# Patient Record
Sex: Female | Born: 1965 | Race: Black or African American | Hispanic: No | Marital: Married | Smoking: Never smoker
Health system: Southern US, Community
[De-identification: ages and names within clinical notes are randomized; demographics above are authoritative.]

## PROBLEM LIST (undated history)

## (undated) DIAGNOSIS — E119 Type 2 diabetes mellitus without complications: Secondary | ICD-10-CM

---

## 2008-07-14 ENCOUNTER — Inpatient Hospital Stay (HOSPITAL_COMMUNITY): Admission: EM | Admit: 2008-07-14 | Discharge: 2008-07-24 | Payer: Self-pay | Admitting: Emergency Medicine

## 2010-06-01 LAB — GLUCOSE, CAPILLARY
Glucose-Capillary: 130 mg/dL — ABNORMAL HIGH (ref 70–99)
Glucose-Capillary: 138 mg/dL — ABNORMAL HIGH (ref 70–99)
Glucose-Capillary: 179 mg/dL — ABNORMAL HIGH (ref 70–99)

## 2010-06-02 LAB — BASIC METABOLIC PANEL
BUN: 1 mg/dL — ABNORMAL LOW (ref 6–23)
CO2: 27 mEq/L (ref 19–32)
CO2: 29 mEq/L (ref 19–32)
CO2: 29 mEq/L (ref 19–32)
Calcium: 8.1 mg/dL — ABNORMAL LOW (ref 8.4–10.5)
Calcium: 8.4 mg/dL (ref 8.4–10.5)
Calcium: 9.2 mg/dL (ref 8.4–10.5)
Chloride: 100 mEq/L (ref 96–112)
Chloride: 107 mEq/L (ref 96–112)
Chloride: 97 mEq/L (ref 96–112)
Creatinine, Ser: 0.56 mg/dL (ref 0.4–1.2)
GFR calc Af Amer: >60 mL/min (ref >60)
GFR calc Af Amer: >60 mL/min (ref >60)
GFR calc Af Amer: >60 mL/min (ref >60)
GFR calc non Af Amer: >60 mL/min (ref >60)
GFR calc non Af Amer: >60 mL/min (ref >60)
GFR calc non Af Amer: >60 mL/min (ref >60)
Glucose, Bld: 131 mg/dL — ABNORMAL HIGH (ref 70–99)
Glucose, Bld: 181 mg/dL — ABNORMAL HIGH (ref 70–99)
Potassium: 3.4 mEq/L — ABNORMAL LOW (ref 3.5–5.1)
Potassium: 3.7 mEq/L (ref 3.5–5.1)
Sodium: 135 mEq/L (ref 135–145)
Sodium: 135 mEq/L (ref 135–145)
Sodium: 141 mEq/L (ref 135–145)

## 2010-06-02 LAB — URINE CULTURE: Culture: NO GROWTH

## 2010-06-02 LAB — BODY FLUID CULTURE

## 2010-06-02 LAB — CBC
HCT: 40.1 % (ref 36.0–46.0)
HCT: 43.2 % (ref 36.0–46.0)
Hemoglobin: 12.7 g/dL (ref 12.0–15.0)
Hemoglobin: 13.8 g/dL (ref 12.0–15.0)
Hemoglobin: 9.8 g/dL — ABNORMAL LOW (ref 12.0–15.0)
MCHC: 32 g/dL (ref 30.0–36.0)
MCHC: 32.7 g/dL (ref 30.0–36.0)
MCV: 88.5 fL (ref 78.0–100.0)
MCV: 88.8 fL (ref 78.0–100.0)
Platelets: 358 10*3/uL (ref 150–400)
RBC: 3.39 MIL/uL — ABNORMAL LOW (ref 3.87–5.11)
RBC: 3.89 MIL/uL (ref 3.87–5.11)
RBC: 4.51 MIL/uL (ref 3.87–5.11)
RDW: 13.4 % (ref 11.5–15.5)
RDW: 13.4 % (ref 11.5–15.5)
WBC: 9 10*3/uL (ref 4.0–10.5)
WBC: 9.4 10*3/uL (ref 4.0–10.5)
WBC: 9.8 10*3/uL (ref 4.0–10.5)

## 2010-06-02 LAB — GLUCOSE, CAPILLARY
Glucose-Capillary: 109 mg/dL — ABNORMAL HIGH (ref 70–99)
Glucose-Capillary: 121 mg/dL — ABNORMAL HIGH (ref 70–99)
Glucose-Capillary: 136 mg/dL — ABNORMAL HIGH (ref 70–99)
Glucose-Capillary: 136 mg/dL — ABNORMAL HIGH (ref 70–99)
Glucose-Capillary: 139 mg/dL — ABNORMAL HIGH (ref 70–99)
Glucose-Capillary: 140 mg/dL — ABNORMAL HIGH (ref 70–99)
Glucose-Capillary: 144 mg/dL — ABNORMAL HIGH (ref 70–99)
Glucose-Capillary: 146 mg/dL — ABNORMAL HIGH (ref 70–99)
Glucose-Capillary: 153 mg/dL — ABNORMAL HIGH (ref 70–99)
Glucose-Capillary: 154 mg/dL — ABNORMAL HIGH (ref 70–99)
Glucose-Capillary: 156 mg/dL — ABNORMAL HIGH (ref 70–99)
Glucose-Capillary: 156 mg/dL — ABNORMAL HIGH (ref 70–99)
Glucose-Capillary: 157 mg/dL — ABNORMAL HIGH (ref 70–99)
Glucose-Capillary: 175 mg/dL — ABNORMAL HIGH (ref 70–99)
Glucose-Capillary: 175 mg/dL — ABNORMAL HIGH (ref 70–99)
Glucose-Capillary: 178 mg/dL — ABNORMAL HIGH (ref 70–99)
Glucose-Capillary: 189 mg/dL — ABNORMAL HIGH (ref 70–99)
Glucose-Capillary: 198 mg/dL — ABNORMAL HIGH (ref 70–99)

## 2010-06-02 LAB — URINALYSIS, ROUTINE W REFLEX MICROSCOPIC
Glucose, UA: NEGATIVE mg/dL
Hgb urine dipstick: NEGATIVE
Nitrite: NEGATIVE
Protein, ur: NEGATIVE mg/dL
Specific Gravity, Urine: 1.018 (ref 1.005–1.030)
Urobilinogen, UA: 1 mg/dL (ref 0.0–1.0)
Urobilinogen, UA: 1 mg/dL (ref 0.0–1.0)

## 2010-06-02 LAB — LIPASE, BLOOD: Lipase: 22 U/L (ref 11–59)

## 2010-06-02 LAB — COMPREHENSIVE METABOLIC PANEL
BUN: 5 mg/dL — ABNORMAL LOW (ref 6–23)
Calcium: 10.1 mg/dL (ref 8.4–10.5)
Creatinine, Ser: 0.6 mg/dL (ref 0.4–1.2)
GFR calc non Af Amer: >60 mL/min (ref >60)
Glucose, Bld: 156 mg/dL — ABNORMAL HIGH (ref 70–99)
Total Protein: 7.9 g/dL (ref 6.0–8.3)

## 2010-06-02 LAB — DIFFERENTIAL
Lymphs Abs: 1.7 10*3/uL (ref 0.7–4.0)
Monocytes Relative: 6 % (ref 3–12)
Neutro Abs: 8.9 10*3/uL — ABNORMAL HIGH (ref 1.7–7.7)
Neutrophils Relative %: 78 % — ABNORMAL HIGH (ref 43–77)

## 2010-06-02 LAB — ANAEROBIC CULTURE

## 2010-06-17 IMAGING — CT CT ABDOMEN W/ CM
2 of 5 series · 17 of 46 positions shown, 19 images · IV contrast (APPLIED)
Comparison: None

CT ABDOMEN

CLINICAL DATA: Abdominal pain, vomiting.

CT ABDOMEN AND PELVIS WITH CONTRAST
TECHNIQUE: Multidetector CT imaging of the abdomen and pelvis was
performed using the standard protocol following bolus
administration of intravenous contrast.
Contrast: 125 ml Kmnipaque-ULL

[Series 2: abd_pel 5.0 b40f st · axial · 0.91mm/px · z∈[-14,+376]mm · 14 of 88 slices shown, 16 images]
[im 5/88  soft-tissue]
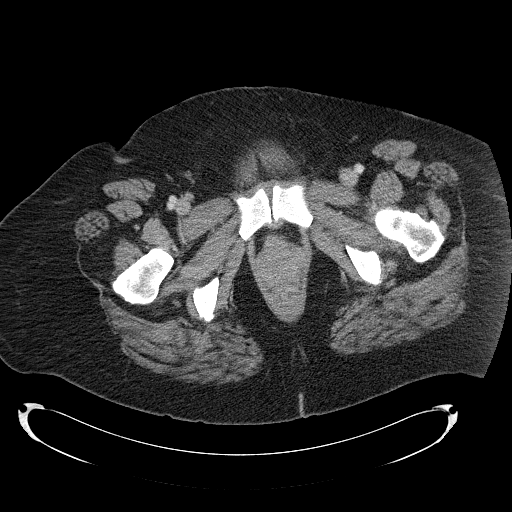
[im 5/88  bone]
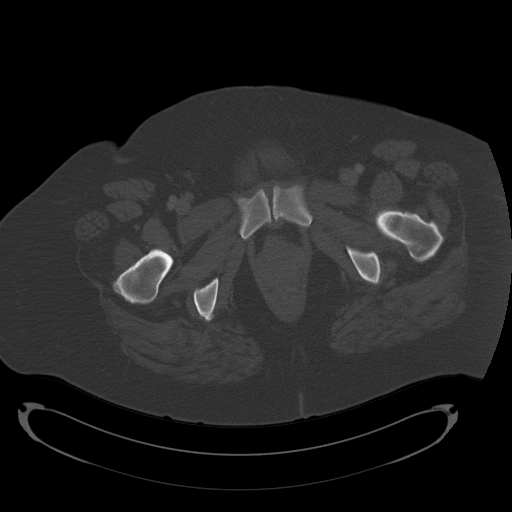
[im 10/88  soft-tissue]
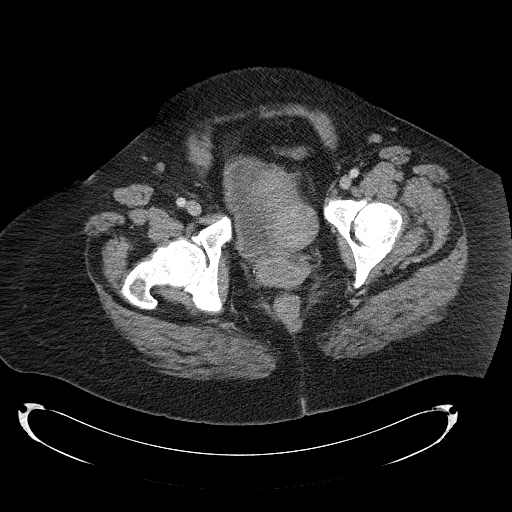
[im 19/88  soft-tissue]
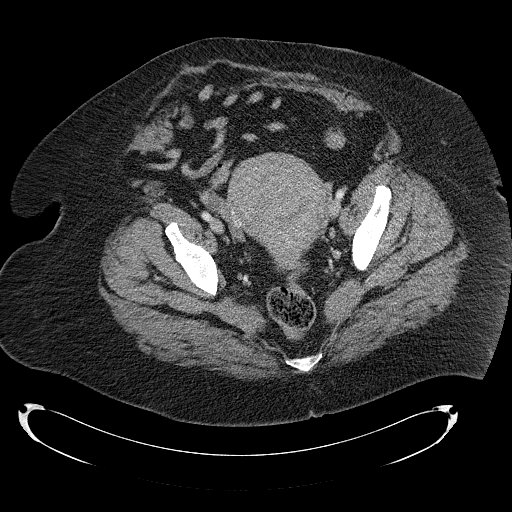
[im 23/88  soft-tissue]
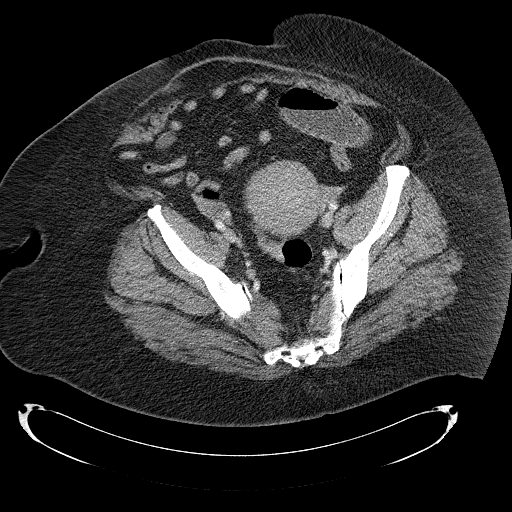
[im 28/88  soft-tissue]
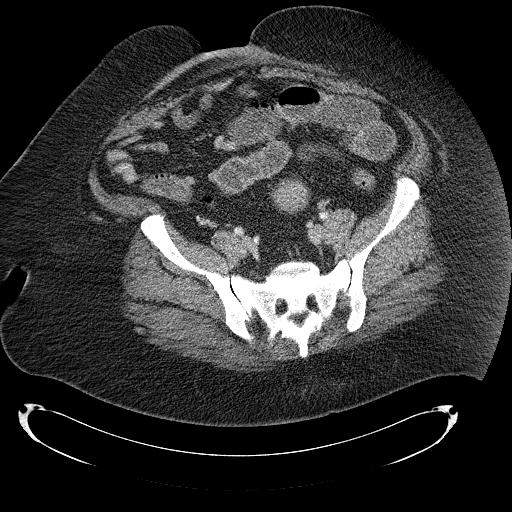
[im 37/88  soft-tissue]
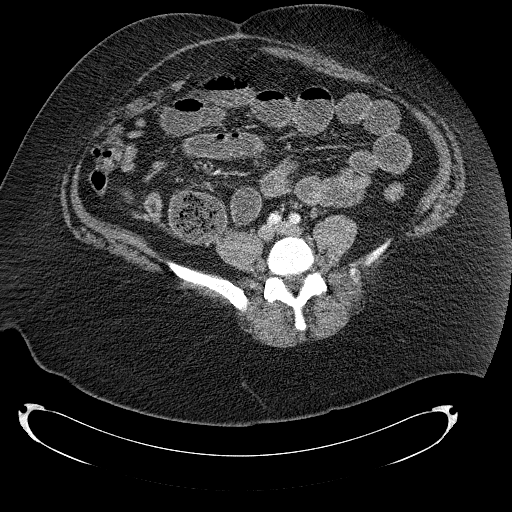
[im 42/88  soft-tissue]
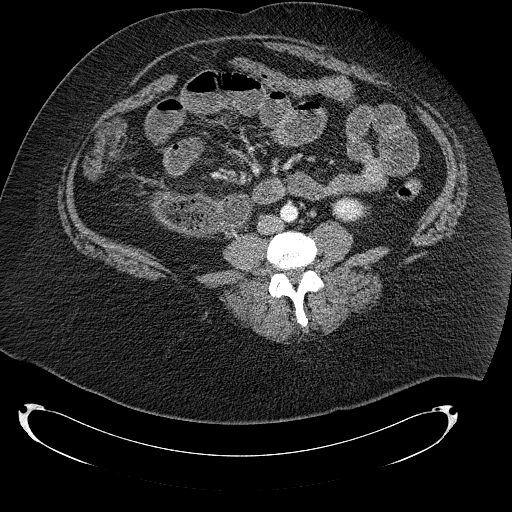
[im 46/88  soft-tissue]
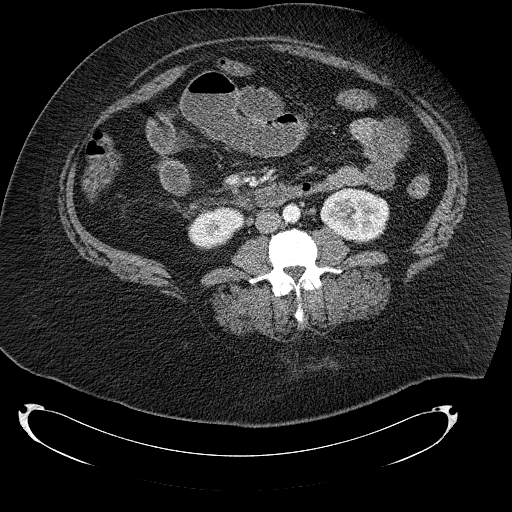
[im 51/88  soft-tissue]
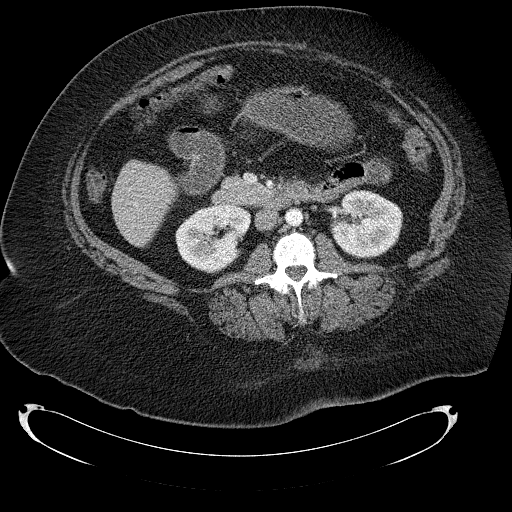
[im 51/88  bone]
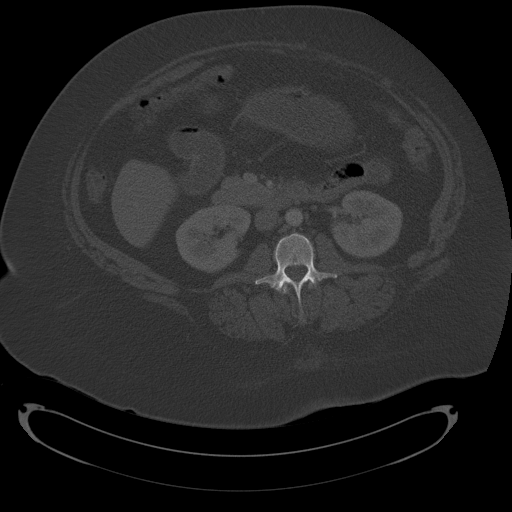
[im 60/88  soft-tissue]
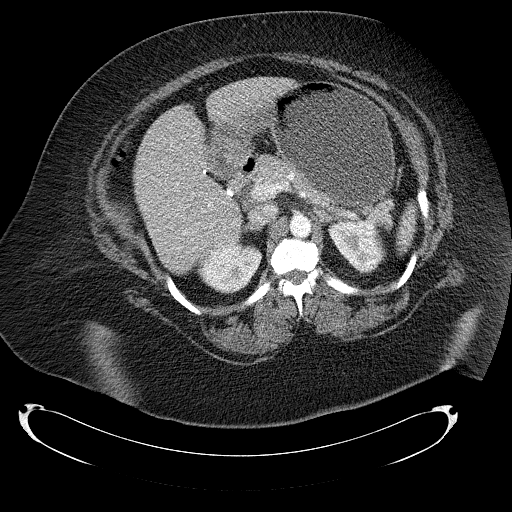
[im 65/88  soft-tissue]
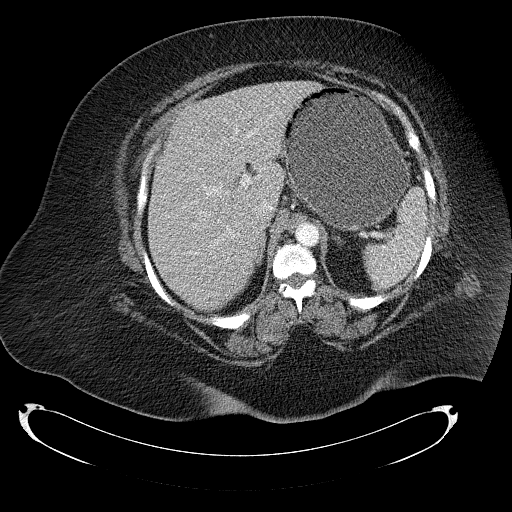
[im 69/88  soft-tissue]
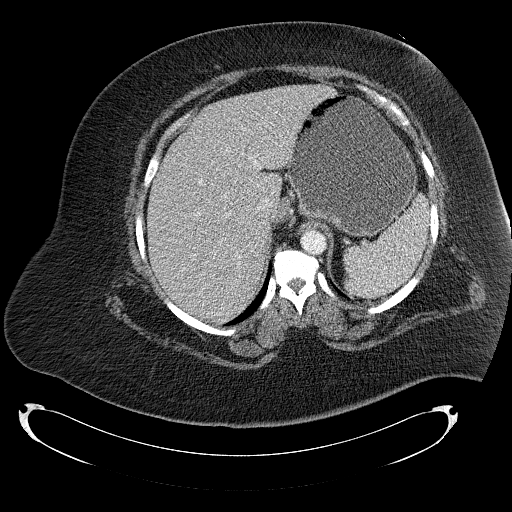
[im 78/88  soft-tissue]
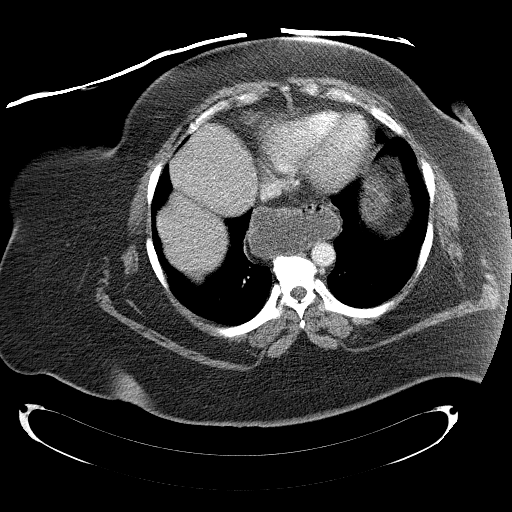
[im 83/88  soft-tissue]
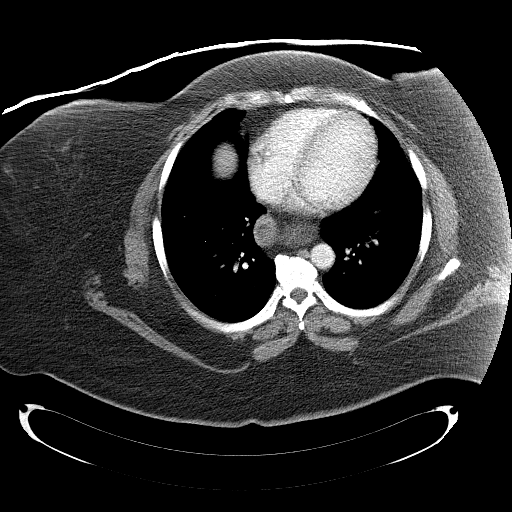

[Series 602: coronal · coronal · 0.91mm/px · 3 of 104 slices shown]
[im 35/104  soft-tissue]
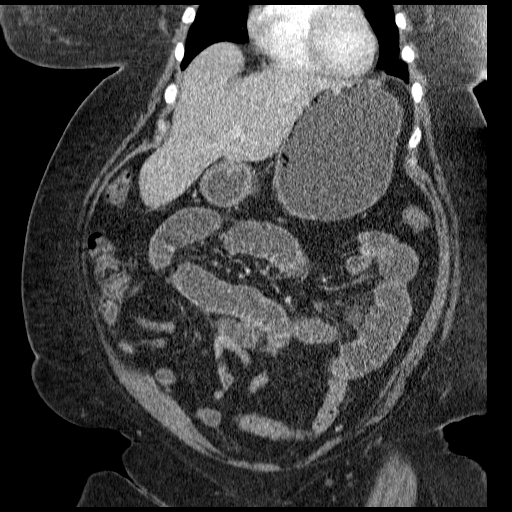
[im 46/104  soft-tissue]
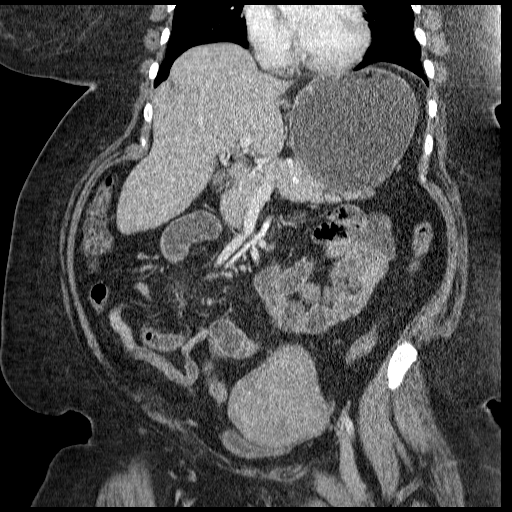
[im 58/104  soft-tissue]
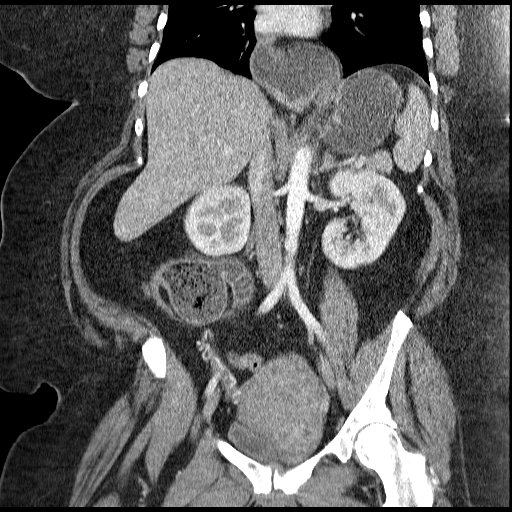

[17 of 46 positions shown; findings below may reference images not displayed]

FINDINGS: There is a large hiatal hernia present.  Distal
esophagus is fluid-filled, likely related to reflux.  Stomach
mildly dilated fluid-filled.  Dilated proximal small bowel loops
are present with air-fluid levels.  Distal small bowel in the right
lower quadrant is decompressed.  There is a transition to normal
caliber small bowel in the right lower abdomen and upper pelvis.
Findings compatible with partial small bowel obstruction.

Liver, spleen, pancreas, adrenals, kidneys unremarkable. No
hydronephrosis. No biliary ductal dilatation. The patient is status
post cholecystectomy.  Aorta is normal caliber.  No free fluid,
free air, or adenopathy.  Appendix is visualized and is normal.

Lung bases are clear.  No effusions.  Heart is normal size.
IMPRESSION: Dilated proximal small bowel with a transition to normal caliber
distal small bowel in the right lower quadrant.  Findings
compatible with partial small bowel obstruction.

Prior cholecystectomy.

Large hiatal hernia which is fluid filled.  Fluid-filled distal
esophagus suggests reflux.

CT PELVIS
FINDINGS: Again, dilated small bowel with transition to normal
caliber distal small bowel noted compatible with small bowel
obstruction.

Enlargement the uterus with heterogeneity, suggesting fibroids.
Adnexa unremarkable.  No free fluid, free air, or adenopathy.
IMPRESSION: Findings compatible with small scar.  Findings compatible with
partial small bowel obstruction as described above.

Uterine fibroids.

## 2010-06-18 IMAGING — CR DG ABDOMEN 2V
4 series · 4 of 4 positions shown · non-contrast
Comparison: CT examination 07/13/2008

CLINICAL DATA: History of small bowel obstruction.

ABDOMEN - 2 VIEW

[w abdomen upright *]
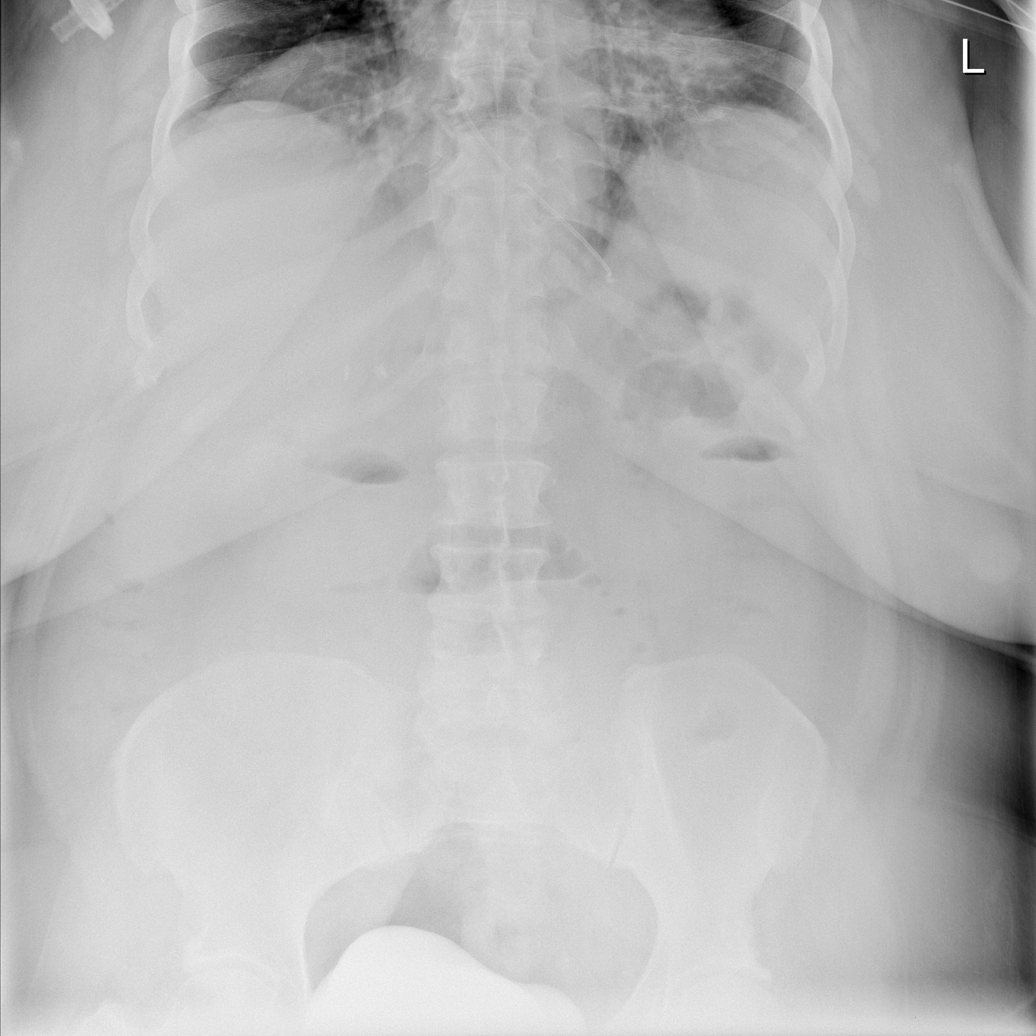

[t abdomen supine *]
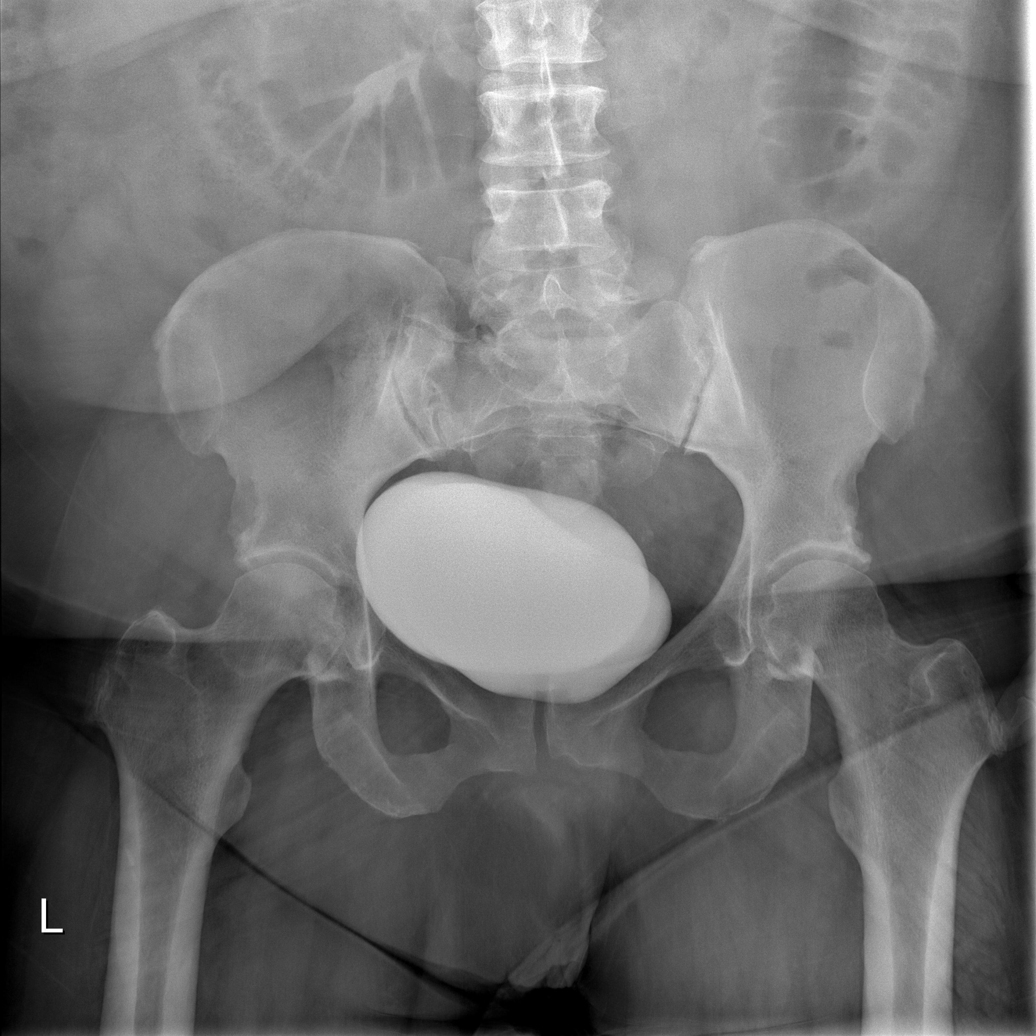

[t abdomen supine (1 of 2)]
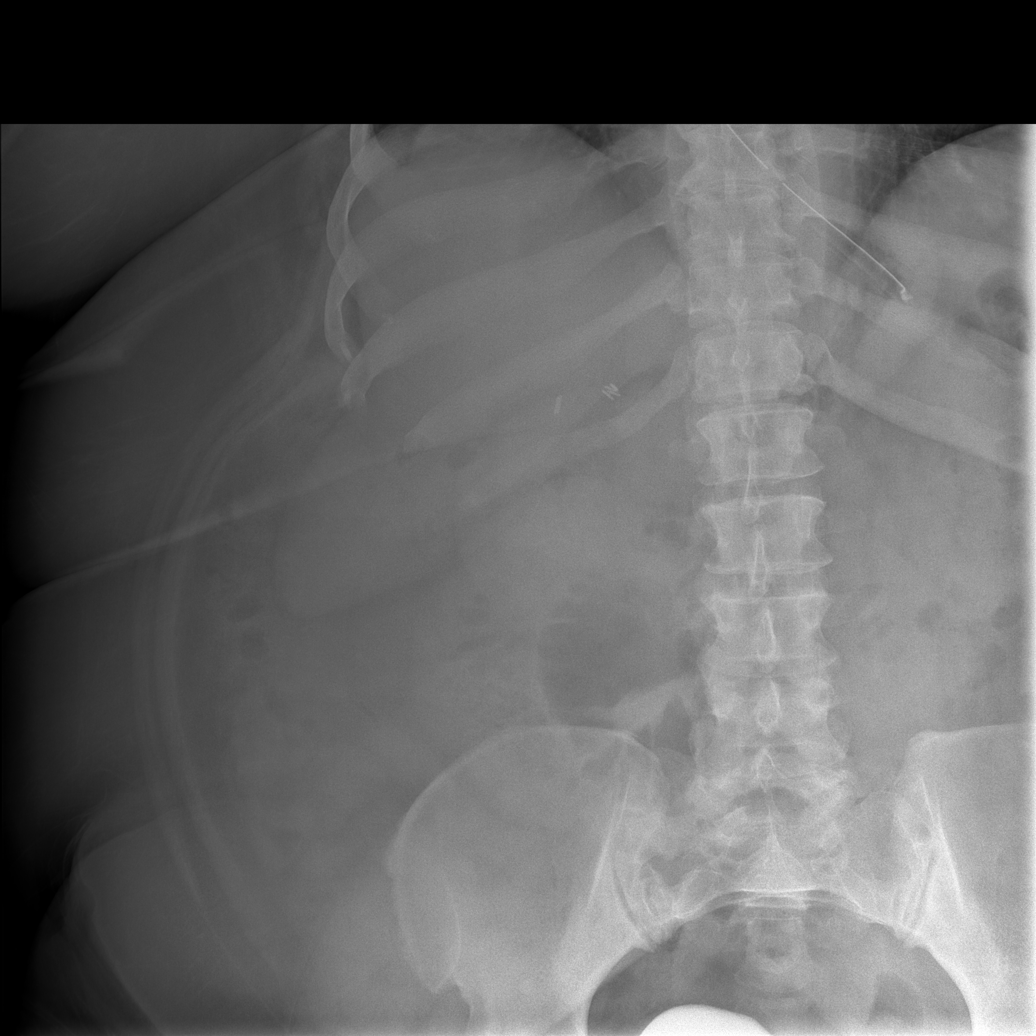

[t abdomen supine (2 of 2)]
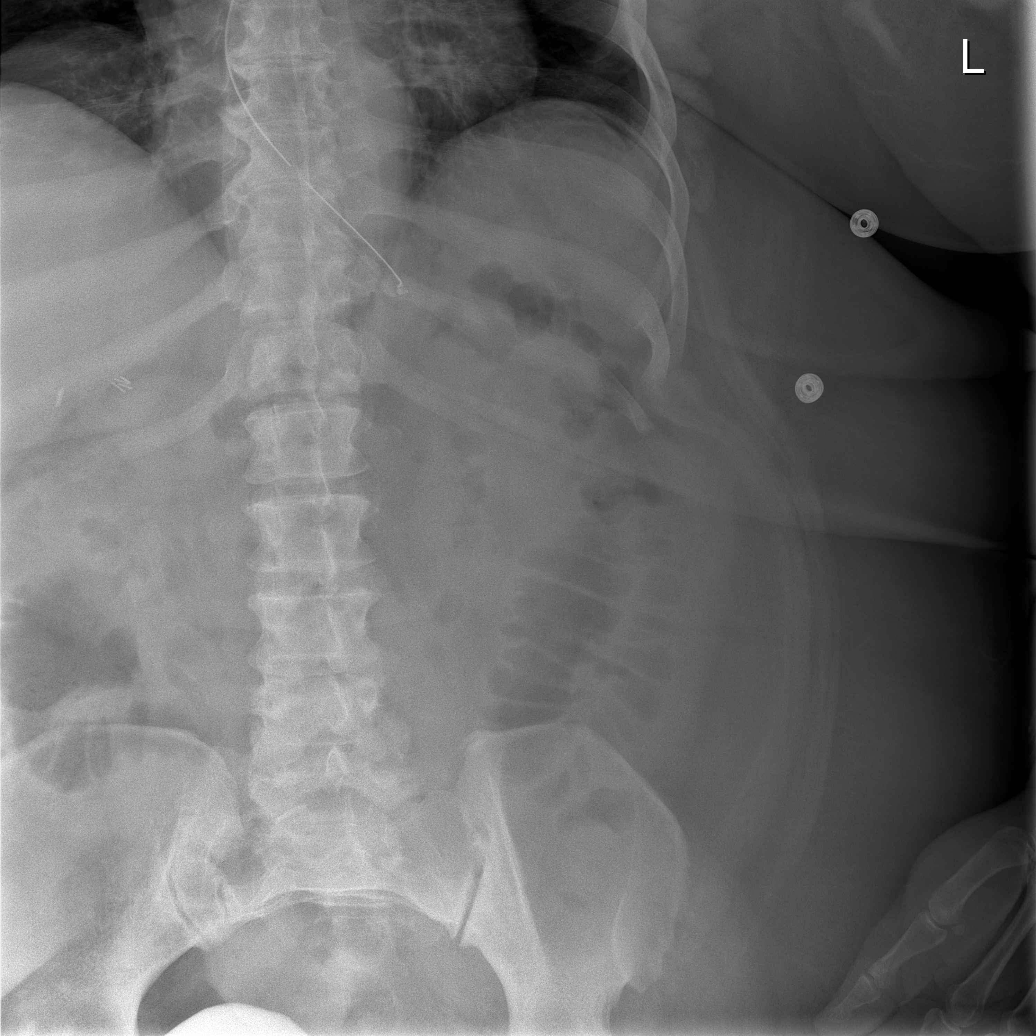

[4 of 4 positions shown; findings below may reference images not displayed]

FINDINGS: Distended loops of small bowel within the mid abdomen.  A
paucity of bowel gas distally.  NG tube extends to the stomach.

Contrast within the bladder is secondary to a recent CT
examination.
IMPRESSION: Persistent small bowel obstruction.  Recommend continued follow up.

## 2010-07-07 NOTE — Op Note (Signed)
NAME:  RAILEY, GLAD NO.:  [id number]   MEDICAL RECORD NO.:  [id number]          PATIENT TYPE:  INP   LOCATION:  1522                         FACILITY:  [facility]   PHYSICIAN:  Anselm Pancoast. Weatherly, M.D.DATE OF BIRTH:  1965-06-19   DATE OF PROCEDURE:  07/17/2008  DATE OF DISCHARGE:                               OPERATIVE REPORT   PREOPERATIVE DIAGNOSES:  Small bowel obstruction, probably adhesions,  morbid obesity and status post multiple hernias, ventral and incisional.   OPERATION:  Exploratory laparotomy and lysis of adhesions and release of  mechanical small-bowel obstruction that was food bezoar in the distal  ileum.   ANESTHESIA:  General.   SURGEON:  Anselm Pancoast. Zachery Dakins, M.D.   ASSISTANT:  Thornton Park. Daphine Deutscher, MD.   HISTORY:  Moira Umholtz is a 45 year old about 350 pound female who has a  complex history that goes as follows.  Her second pregnancy she had to  have a C-section.  She had postoperative complications and was found to  have a sponge intra-abdominally.  This was removed on a second  operation.  She had problems with infections, dehisced and ultimately  got better.  She had a another pregnancy about 3 years later, another C-  section.  This time she had an incisional hernia and then she has had an  abdominoplasty and repair with large piece of mesh extraperitoneally.  The last surgeries were about 8 years ago and she has moved here from  [place] and started having abdominal cramping episodes of pain  approximately 3-4 days before she came to the emergency room on Saturday  evening.  She does not have a medical physician.  She is noted to be  mildly diabetic and hypertensive and we have treated her with NG tube,  NG suctions, IV's  and she appeared to be improving but over the last 3  days has really been no improvement.  She has had a small amount of  stool that was precipitated with an enema.  She has got a little bit of  gas in her colon  but still quite dilated loops of small bowel and on the  CT that was done Saturday that looks like there is a transition down in  the right lower quadrant as the actual point of obstruction.  The I  suggested possibly surgery yesterday.  She wanted to wait another day.  She said she has had four episodes of kind of partial obstruction, one  required hospitalization that did relieve with a nasogastric tube, but  with no improvement today, she agrees for surgery.   DESCRIPTION OF PROCEDURE:  She was positioned on the OR table.  I did  start her on Zosyn preoperatively earlier today after a urine culture  showed greater 100,000 of bacteria but the urinalysis itself was  negative.  She has PAS stockings.  She is on Lovenox 60 mg a day and  positioned herself of the OR table.  Induction of general anesthesia  with the slide guide scope.  The abdomen was then prepped with Betadine  solution and draped in sterile  manner.  She has had a midline incision  of abdominoplasty and then also a big area right at the center that  obviously there has been skin loss that was allowed to kind of secondary  granulate and you can feel the mesh at that point.  The incision, I went  through the top part of the original midline incision, made a little  skin incision down very carefully.  You could identify the top part of  the mesh and then I went about 1 inch higher so I could actually go  intraperitoneally at the area where there is no actual mesh.  Sharp  dissection very carefully opening up the various layers and then I could  get my finger in the peritoneum but there is a big wad of omentum there  and then very carefully opened the mesh to give [place] exposure.  We then  went lateral to the left and then for the first kind of entered into the  free peritoneal space and there was a lot of kind of thin ascites type  fluid that did not have any odor and I sent an anaerobic and aerobic  culture.  We then kind of  dissected, freeing up this omentum that was  adherent to the midline portion of the incision and in the right lower  quadrant.  There was a little what was really a kind of a fibrous bridge  that looks like the bowel has been kind of twisted around that but the  actual point this food bezoar was down more in the right lower abdomen,  probably about 3 feet from the ileocecal valve area.  The extensive  adhesions that were freeing this up down were carefully divided and we  were able to go ahead and free it up completely.  I was really wondering  about making an enterotomy and getting the food bezoar out.  Dr. Daphine Deutscher  thought that it would be better just to manually break it up and whether  it is peanuts or whatever, I am not sure but then after I had to lysed  the distal adhesions right on down to the ileocecal valve, we could  massage the food in smaller portions on down to the terminal ileum and  some actually to the cecum.  The little area where the thinnest portion  where this looked like the bezoar was kind of kinking on itself, I did  put about four sutures Lemberts to reinforce the antimesenteric surface.  The small bowel was then placed in good anatomical position.  The  omentum that was adherent to the upper portion of the incision was freed  so I could bring it down under the complete incision.  On the area on  the right there appears to be one little area where the kind of weakness  in the abdominal wall and I sutured the lateral edge of the fascia under  to the actual mesh that is over it with #1 Novofils.  Next the most  inferior aspect incision where this really hypertrophied scar is I freed  up the skin over this overlying fascia and for about 2 inches put the  knots inverted interrupted #1 knots inverted and then switched to the  knots on the extra fascial and probably put the sutures about a  centimeter apart, more sutures than usual because of her size and mesh  with  basically very little fascia itself.  The omentum I was careful  that the omentum is  under the portion of the incision and then the skin  I tried to free up and see if I could bring the skin together with  subcutaneous sutures, but really where hypertrophied area is I just  brought the skin together with staples, had freed it up about inch and  laterally on both sides and then 4x4s and abdominal binder.  The patient  will be kept n.p.o. and an NG tube.  She __________  got a PICC line and  is on Zosyn and we are going to keep on with the Lovenox.  I expected it  will be 3 or 4 days at  the earliest before she actually starts having  good bowel function and then will take the NG tube out.  She is still  moderately distended but hopefully will start bowel movements soon.  Sponge and needle counts were correct.  Estimated blood loss was  minimal.  There were a few little areas of the mesentery that some 3-0  silk sutures were used to lyse adhesions but there was very little blood  loss.  Sponge count was correct x2 at completion of surgery.  Her BMI is  55.      Anselm Pancoast. Zachery Dakins, M.D.  Electronically Signed     WJW/MEDQ  D:  07/17/2008  T:  07/18/2008  Job:  [id number]

## 2010-07-07 NOTE — H&P (Signed)
NAME:  Kendra, Rice NO.:  [id number]   MEDICAL RECORD NO.:  [id number]          PATIENT TYPE:  EMS   LOCATION:  ED                           FACILITY:  [facility]   PHYSICIAN:  Anselm Pancoast. Weatherly, M.D.DATE OF BIRTH:  [birth year]   DATE OF ADMISSION:  07/13/2008  DATE OF DISCHARGE:                              HISTORY & PHYSICAL   CHIEF COMPLAINT:  Nausea and vomiting for approximately 24 hours,  exogenous obesity.   HISTORY:  Kendra Rice is a 45 year old black female who lives in  [place] who presented to the emergency room today with about a 24-hour  history of abdominal pain and vomiting.  Her past history is significant  in that she has had problems with a hernia and abdominal problems  requiring surgery. Then she dehisced, but she was repaired with an  onlaid mesh. This was 5 or 6 years ago when she lived in [place].  Since then she said she has had 4 episodes of abdominal pain and 1  required hospitalization for bowel blockage. And the pain would usually  last about several days. 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  She states that she previously weighed about  285, now she weighs about 360. And her family members with her said that  they have encouraged her to try to lose weight, but they have been  unsuccessful.  The patient is not on any type of chronic medications,  but not sure if she sees a physician on a regular basis.  She was mildly  hypertensive when she presented to the emergency room but she is not on  any antihypertensive medications, and she denies problems with her  sugar. She was seen by Dr. Freida Busman. First plain abdominal films were  performed which shows dilated loops of small bowel. Then a CT with  contrast was performed that showed basically a transition of dilated  small bowel to decompressed small bowel kind of within the pelvis.  On  the CT you can see a large piece of mesh. You can also feel the mesh  below a very hypertrophied incision where the umbilicus was previously,  and it appears that the mesh is kind of separated from the abdominal  wall but according to the CT you do not see any loops of bowel sort of  caught up in the area between the abdominal wall and the mesh.  The  patient has an extremely dilated stomach on the CT and permission was  obtained and we placed an NG tube with approximately 1200 mL of bilious  and oral contrast removed from the stomach through a #18 NG tube, and  the patient states that the nausea is hopefully somewhat better. On  abdominal exam she certainly does not have peritonitis-type symptoms and  thinks she last had a bowel movement approximately 24 hours ago.  On the  CT it looks like she has a fibroid of her uterus, but there are no other  abnormalities that we can definitely see on the CT.   LABORATORY STUDIES:  Her hematocrit is 43.2, white count 14,400.  Serum  electrolytes are normal with BUN of 5 and creatinine is 0.6, glucose is  156.  Urinalysis unremarkable.  A lipase is 22.  Liver function studies  are normal with an SGOT of 32, SGPT of 16 and do not see a bilirubin I  expect is normal.   PHYSICAL EXAM:  VITAL SIGNS:  Temperature is 98.9 pulse is 94,  respirations 20, blood pressure is 159/102 initially.  GENERAL APPEARANCE: She is a very large black female.  ABDOMEN:  There are few bowel sounds.  She certainly does not show signs  of acute peritonitis.  PELVIC:  I did not do a pelvic exam on her but on the CT you can see a  large fibroid of her uterus. The small bowel transition from dilated to  decompress appears to be in the pelvis.  EXTREMITIES: She is not edematous.  CNS: Grossly physiologic.   ADMISSION IMPRESSION:  1. Small bowel obstruction probably secondary to adhesions.  She is      status post repair of a very large ventral hernia with onlaid mesh.       Family says that they operated on her, she dehisced, the wound      popped open, and they repaired her with mesh on an urgent basis.   PLAN:  The patient will be admitted for IV fluids, NG suction and I will  place her on Lovenox, repeat x-rays in the morning and all attempts  possible to treat her with nonoperative means will be done as with her  shape, size and previous mesh and big ventral hernia the operative risks  would be significant.      Anselm Pancoast. Zachery Dakins, M.D.  Electronically Signed     WJW/MEDQ  D:  07/14/2008  T:  07/14/2008  Job:  [id number]

## 2010-07-10 NOTE — Discharge Summary (Signed)
NAME:  JAQUESHA, BOROFF NO.:  [id number]   MEDICAL RECORD NO.:  [id number]          PATIENT TYPE:  INP   LOCATION:  1522                         FACILITY:  [facility]   PHYSICIAN:  Anselm Pancoast. Weatherly, M.D.DATE OF BIRTH:  May 08, 1965   DATE OF ADMISSION:  07/13/2008  DATE OF DISCHARGE:  07/24/2008                               DISCHARGE SUMMARY   DISCHARGE DIAGNOSES:  1. Small-bowel obstruction secondary to adhesions and food bezoar in      terminal ileum.  2. Morbid obesity.  3. History of previous multiple abdominal surgeries with      reconstruction of abdominal wall with mesh.   HISTORY:  Jeylin Woodmansee is a 45 year old markedly overweight female who  came to the emergency room with approximately a 3 day history of  cramping abdominal pain and then about 24 hours of nausea and vomiting.  The patient's weight is about 380 pounds.  I think she is 5 feet 3  inches.  Her past medical history is significant in that on her second  pregnancy had a C. section and had problems postoperatively with a  sponge left in.  This was removed later, but developed a wound  infection.  She then had a third pregnancy and another C. section.  Then  after that, had wound problems and then had plastic surgery and also  mesh reinforcement of the lower abdomen.  The patient was living in [place] at the time.  Moved to [facility] area several years ago and is  presently not working.  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  She presented to the emergency room.  The CT ordered by the ER  physician showed a small bowel obstruction with a markedly dilated  stomach and I was asked to see the patient.   On examination, she was not febrile.  I placed an NG tube and she had  about 2000 mL in her stomach and felt better after the NG tube had been  placed.  We admitted her to the floor.  On the history, she said that  she has had 3-4 episodes similar to this with nausea and  vomiting,  bloating and cramping, two I think that required hospitalization.  The  symptoms would gradually subside.  I admitted her to 15.   LABORATORY DATA:  White count of 11,400, hematocrit was 43.  Her serum  electrolytes initially were okay with BUN of 5 and glucose of 156.  She  is not a known diabetic, but is markedly overweight.   We placed her on IV fluids.  Repeated the plain abdominal films next  morning which showed still dilated loops of small bowel.  I gave her a  couple of enemas with some stool being passed initially, but then no  further stool and then repeat x-rays the second day.  The NG drainage  really was not decreasing and was still quite bilious.  We had re-  positioned the NG tube and it was apparent that she was not going to  open up properly.  It has  always been kind of a food bezoar type thing  in the distal small bowel that was seen most thoroughly on the CT, but  with this area not breaking up, I recommended that we proceed on to  surgery and she was in agreement.   On Jul 17, 2008, she was taken to surgery.  Dr. Daphine Deutscher assisted and we  opened the midline incision from her previous surgery and went through  the piece of mesh and some onlay mesh.  The patient had extensive  adhesions in the lower abdomen and we carefully freed the adhesions up.  Then in the distal small bowel, there was a marked kink in the terminal  ileum with a large food bezoar that we manually broke up.  When we broke  it up, the contents after the adhesions had been lysed, some of this  food would go on into the ileocecal valve area.  Dr. Daphine Deutscher thought that  it would certainly be best not to open up the incision.  I was  considering possibly opening the enterotomy and taking out this large  area, but it did manually break up and we continued with the NG suction  postoperatively.  I left town after about 24 hours after surgery and she  was followed by Dr. Daphine Deutscher afterwards.  She did  start having some flatus  on about the second postoperative day and then had a bowel movement.  Her electrolytes were doing okay.  The white count was normal.  Her  potassium did drift down to 3.1 even though she was having 20 mEq of  potassium in her IVs and we had placed her on Zosyn perioperatively.  Her incision appeared to be healing nicely.  Her diet was advanced.  On  the sixth postoperative day, she appeared be doing fine.  We had given  her Lovenox postoperatively.  Dr. Daphine Deutscher asked that the nurses remove  her staples and Steri-Strip her incision before she was released.  Upon  separating the area right where the patient had an abdominoplasty,  there were one small area that the skin was basically on the mesh and I  had freed this up.  There was a small area of separation.  The visiting  nurses will follow her and I will see her back in the office in  approximately a week.  Dr. Daphine Deutscher said he did not actually see the  incision, but the nurses had called him that there was a little bit of  skin separation with removal of these staples.   PLAN:  The patient was discharged on Vicodin for pain.  She is not on  antibiotics and will be followed in our office.  Hopefully, this wound  is going to heal without evidence of infection.  Of course her mesh has  been placed about 5 years or so, so hopefully it will do nicely.  I will  see her in the office in approximately 7-10 days.  If she is having  problems with fever, nausea, vomiting or whatever, she will call or be  seen sooner.      Anselm Pancoast. Zachery Dakins, M.D.  Electronically Signed     WJW/MEDQ  D:  08/01/2008  T:  08/01/2008  Job:  [id number]   cc:   Anselm Pancoast. Zachery Dakins, M.D.  [address]  [place] [address]

## 2012-11-16 ENCOUNTER — Encounter (HOSPITAL_COMMUNITY): Payer: Self-pay | Admitting: Dietician

## 2012-11-16 NOTE — Progress Notes (Signed)
[facility] Diabetes Class Completion  Date:November 16, 2012  Time: 1730  Pt attended [facility] Education Class on November 16, 2012.   Patient was educated on the following topics:   -Survival skills (signs and symptoms of hyperglycemia and hypoglycemia, treatment for hypoglycemia, ideal levels for fasting and postprandial blood sugars, goal Hgb A1c level, foot care basics)  -Recommendations for physical activity   -Carbohydrate metabolism in relation to diabetes   -Meal planning (sources of carbohydrate, carbohydrate counting, meal planning strategies, food label reading, and portion control).  Handouts provided:  -"Diabetes and You: Taking Charge of Your Health"  -"Carbohydrate Counting and Meal Planning"  -"Your Guide to Better Office Visits"   Brixton Schnapp A. Jaquetta Currier, RD, LDN   

## 2020-05-07 ENCOUNTER — Ambulatory Visit: Payer: Self-pay | Admitting: Nurse Practitioner

## 2020-06-05 ENCOUNTER — Ambulatory Visit: Payer: Self-pay | Admitting: Nurse Practitioner

## 2020-10-24 ENCOUNTER — Ambulatory Visit: Payer: Self-pay | Admitting: Nurse Practitioner

## 2020-11-26 ENCOUNTER — Ambulatory Visit: Payer: Self-pay | Admitting: Nurse Practitioner

## 2020-12-10 ENCOUNTER — Ambulatory Visit: Payer: Self-pay | Admitting: Nurse Practitioner

## 2021-09-21 ENCOUNTER — Other Ambulatory Visit: Payer: Self-pay

## 2021-09-21 ENCOUNTER — Encounter (HOSPITAL_COMMUNITY): Payer: Self-pay

## 2021-09-21 ENCOUNTER — Emergency Department (HOSPITAL_COMMUNITY)
Admission: EM | Admit: 2021-09-21 | Discharge: 2021-09-21 | Disposition: A | Discharge status: Home / Self Care | Payer: Self-pay | Attending: Emergency Medicine | Admitting: Emergency Medicine

## 2021-09-21 ENCOUNTER — Emergency Department (HOSPITAL_COMMUNITY): Payer: Self-pay

## 2021-09-21 DIAGNOSIS — M542 Cervicalgia: Secondary | ICD-10-CM | POA: Diagnosis not present

## 2021-09-21 DIAGNOSIS — Y92410 Unspecified street and highway as the place of occurrence of the external cause: Secondary | ICD-10-CM | POA: Diagnosis not present

## 2021-09-21 DIAGNOSIS — M545 Low back pain, unspecified: Secondary | ICD-10-CM | POA: Insufficient documentation

## 2021-09-21 HISTORY — DX: Type 2 diabetes mellitus without complications: E11.9

## 2021-09-21 MED ORDER — NAPROXEN 500 MG PO TABS: 500.0000 mg | ORAL_TABLET | Freq: Two times a day (BID) | ORAL | 0 refills | Status: AC

## 2021-09-21 MED ORDER — NAPROXEN 500 MG PO TABS
500.0000 mg | ORAL_TABLET | Freq: Once | ORAL | Status: AC
  Administered 2021-09-21: 500 mg via ORAL
  Filled 2021-09-21: qty 1

## 2021-09-21 MED ORDER — LIDOCAINE 5 % EX PTCH
1.0000 | MEDICATED_PATCH | CUTANEOUS | Status: DC
  Administered 2021-09-21: 1 via TRANSDERMAL
  Filled 2021-09-21: qty 1

## 2021-09-21 MED ORDER — METHOCARBAMOL 500 MG PO TABS: 500.0000 mg | ORAL_TABLET | Freq: Two times a day (BID) | ORAL | 0 refills | Status: AC | PRN
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
## 2021-09-21 NOTE — Discharge Instructions (Signed)
Alternate ice and heat to areas of injury 3-4 times per day to limit inflammation and spasm.  Avoid strenuous activity and heavy lifting.  We recommend consistent use of naproxen in addition to Robaxin for muscle spasms. Do not drive or drink alcohol after taking Robaxin as it may make you drowsy and impair your judgment.  We recommend follow-up with a primary care doctor to ensure resolution of symptoms.  Return to the ED for any new or concerning symptoms. 

## 2022-04-08 ENCOUNTER — Emergency Department (HOSPITAL_COMMUNITY): Payer: Commercial Managed Care - HMO

## 2022-04-08 ENCOUNTER — Other Ambulatory Visit: Payer: Self-pay

## 2022-04-08 ENCOUNTER — Encounter (HOSPITAL_COMMUNITY): Payer: Self-pay

## 2022-04-08 ENCOUNTER — Emergency Department (HOSPITAL_COMMUNITY)
Admission: EM | Admit: 2022-04-08 | Discharge: 2022-04-08 | Disposition: A | Discharge status: Home / Self Care | Payer: Commercial Managed Care - HMO | Attending: Emergency Medicine | Admitting: Emergency Medicine

## 2022-04-08 ENCOUNTER — Other Ambulatory Visit (HOSPITAL_COMMUNITY): Payer: Self-pay

## 2022-04-08 DIAGNOSIS — S42351A Displaced comminuted fracture of shaft of humerus, right arm, initial encounter for closed fracture: Secondary | ICD-10-CM | POA: Insufficient documentation

## 2022-04-08 DIAGNOSIS — Y92410 Unspecified street and highway as the place of occurrence of the external cause: Secondary | ICD-10-CM | POA: Diagnosis not present

## 2022-04-08 DIAGNOSIS — S80212A Abrasion, left knee, initial encounter: Secondary | ICD-10-CM | POA: Insufficient documentation

## 2022-04-08 DIAGNOSIS — S0181XA Laceration without foreign body of other part of head, initial encounter: Secondary | ICD-10-CM | POA: Diagnosis not present

## 2022-04-08 DIAGNOSIS — S0990XA Unspecified injury of head, initial encounter: Secondary | ICD-10-CM

## 2022-04-08 DIAGNOSIS — S4991XA Unspecified injury of right shoulder and upper arm, initial encounter: Secondary | ICD-10-CM | POA: Diagnosis present

## 2022-04-08 LAB — BASIC METABOLIC PANEL
Anion gap: 11 (ref 5–15)
BUN: 10 mg/dL (ref 6–20)
CO2: 27 mmol/L (ref 22–32)
Calcium: 9.1 mg/dL (ref 8.9–10.3)
Chloride: 100 mmol/L (ref 98–111)
Creatinine, Ser: 0.68 mg/dL (ref 0.44–1.00)
GFR, Estimated: >60 mL/min (ref >60)
Glucose, Bld: 301 mg/dL — ABNORMAL HIGH (ref 70–99)
Potassium: 3.8 mmol/L (ref 3.5–5.1)
Sodium: 138 mmol/L (ref 135–145)

## 2022-04-08 LAB — CBC WITH DIFFERENTIAL/PLATELET
Abs Immature Granulocytes: 0.07 10*3/uL (ref 0.00–0.07)
Basophils Absolute: 0 10*3/uL (ref 0.0–0.1)
Basophils Relative: 0 %
Eosinophils Absolute: 0 10*3/uL (ref 0.0–0.5)
Eosinophils Relative: 0 %
HCT: 35.5 % — ABNORMAL LOW (ref 36.0–46.0)
Hemoglobin: 10.6 g/dL — ABNORMAL LOW (ref 12.0–15.0)
Immature Granulocytes: 1 %
Lymphocytes Relative: 13 %
Lymphs Abs: 1.8 10*3/uL (ref 0.7–4.0)
MCH: 25.6 pg — ABNORMAL LOW (ref 26.0–34.0)
MCHC: 29.9 g/dL — ABNORMAL LOW (ref 30.0–36.0)
MCV: 85.7 fL (ref 80.0–100.0)
Monocytes Absolute: 0.8 10*3/uL (ref 0.1–1.0)
Monocytes Relative: 6 %
Neutro Abs: 11.6 10*3/uL — ABNORMAL HIGH (ref 1.7–7.7)
Neutrophils Relative %: 80 %
Platelets: 396 10*3/uL (ref 150–400)
RBC: 4.14 MIL/uL (ref 3.87–5.11)
RDW: 13.1 % (ref 11.5–15.5)
WBC: 14.3 10*3/uL — ABNORMAL HIGH (ref 4.0–10.5)
nRBC: 0 % (ref 0.0–0.2)

## 2022-04-08 MED ORDER — IOHEXOL 300 MG/ML  SOLN
100.0000 mL | Freq: Once | INTRAMUSCULAR | Status: AC | PRN
  Administered 2022-04-08: 100 mL via INTRAVENOUS

## 2022-04-08 MED ORDER — HYDROCODONE-ACETAMINOPHEN 5-325 MG PO TABS
2.0000 | ORAL_TABLET | Freq: Four times a day (QID) | ORAL | 0 refills | Status: AC | PRN
  Filled 2022-04-08: qty 10, 2d supply, fill #0

## 2022-04-08 MED ORDER — SODIUM CHLORIDE (PF) 0.9 % IJ SOLN
INTRAMUSCULAR | Status: AC
  Filled 2022-04-08: qty 50

## 2022-04-08 MED ORDER — FENTANYL CITRATE PF 50 MCG/ML IJ SOSY
50.0000 ug | PREFILLED_SYRINGE | Freq: Once | INTRAMUSCULAR | Status: AC
  Administered 2022-04-08: 50 ug via INTRAVENOUS
  Filled 2022-04-08: qty 1

## 2022-04-08 NOTE — Progress Notes (Signed)
Orthopedic Tech Progress Note Patient Details:  Kendra Rice February 08, 1966 [id number]  Patient ID: [name], female   DOB: [birth year], 57 y.o.   MRN: [id number]  Kennis Carina 04/08/2022, 9:07 AM Right fiberglass co apt applied.sling applied
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
## 2022-04-08 NOTE — Discharge Instructions (Addendum)
Keep splint in place and follow-up with Dr. Stann Mainland for an appointment.  CT scans are negative for acute traumatic injury.  Take the pain medication as prescribed.  Return to the ED with worsening pain, weakness, numbness, tingling, other concerns.

## 2022-04-08 NOTE — ED Notes (Signed)
Pt is up for discharge however waiting on ortho to splint fracture
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
